# Patient Record
Sex: Female | Born: 1970 | Race: White | Hispanic: No | Marital: Single | Smoking: Former smoker
Health system: Southern US, Community
[De-identification: ages and names within clinical notes are randomized; demographics above are authoritative.]

---

## 2012-12-31 ENCOUNTER — Emergency Department: Payer: Self-pay | Admitting: Emergency Medicine

## 2012-12-31 LAB — URINALYSIS, COMPLETE
Blood: NEGATIVE
Glucose,UR: NEGATIVE mg/dL (ref 0–75)
Leukocyte Esterase: NEGATIVE
Nitrite: NEGATIVE
Squamous Epithelial: 2

## 2012-12-31 LAB — PREGNANCY, URINE: Pregnancy Test, Urine: NEGATIVE m[IU]/mL

## 2013-09-14 ENCOUNTER — Emergency Department: Payer: Self-pay | Admitting: Emergency Medicine

## 2013-09-14 LAB — BASIC METABOLIC PANEL
ANION GAP: 5 — AB (ref 7–16)
BUN: 8 mg/dL (ref 7–18)
CHLORIDE: 109 mmol/L — AB (ref 98–107)
CREATININE: 0.68 mg/dL (ref 0.60–1.30)
Calcium, Total: 9.3 mg/dL (ref 8.5–10.1)
Co2: 24 mmol/L (ref 21–32)
EGFR (African American): >60
EGFR (Non-African Amer.): >60
Glucose: 97 mg/dL (ref 65–99)
OSMOLALITY: 274 (ref 275–301)
POTASSIUM: 3.6 mmol/L (ref 3.5–5.1)
Sodium: 138 mmol/L (ref 136–145)

## 2013-09-14 LAB — URINALYSIS, COMPLETE
BILIRUBIN, UR: NEGATIVE
BLOOD: NEGATIVE
Glucose,UR: NEGATIVE mg/dL (ref 0–75)
Nitrite: NEGATIVE
Ph: 6 (ref 4.5–8.0)
Protein: 30
RBC,UR: <1 /HPF (ref 0–5)
SPECIFIC GRAVITY: 1.028 (ref 1.003–1.030)
Squamous Epithelial: 25
WBC UR: 6 /HPF (ref 0–5)

## 2013-09-14 LAB — CBC WITH DIFFERENTIAL/PLATELET
Basophil #: 0.1 10*3/uL (ref 0.0–0.1)
Basophil %: 0.7 %
Eosinophil #: 0.1 10*3/uL (ref 0.0–0.7)
Eosinophil %: 0.5 %
HCT: 39.2 % (ref 35.0–47.0)
HGB: 13.4 g/dL (ref 12.0–16.0)
Lymphocyte #: 1.9 10*3/uL (ref 1.0–3.6)
Lymphocyte %: 18.2 %
MCH: 32.2 pg (ref 26.0–34.0)
MCHC: 34.2 g/dL (ref 32.0–36.0)
MCV: 94 fL (ref 80–100)
Monocyte #: 0.5 x10 3/mm (ref 0.2–0.9)
Monocyte %: 5.1 %
NEUTROS PCT: 75.5 %
Neutrophil #: 7.8 10*3/uL — ABNORMAL HIGH (ref 1.4–6.5)
Platelet: 260 10*3/uL (ref 150–440)
RBC: 4.16 10*6/uL (ref 3.80–5.20)
RDW: 13.1 % (ref 11.5–14.5)
WBC: 10.4 10*3/uL (ref 3.6–11.0)

## 2013-09-14 LAB — TROPONIN I: Troponin-I: <0.02 ng/mL

## 2018-04-16 ENCOUNTER — Emergency Department: Payer: 59

## 2018-04-16 ENCOUNTER — Encounter: Payer: Self-pay | Admitting: Emergency Medicine

## 2018-04-16 ENCOUNTER — Other Ambulatory Visit: Payer: Self-pay

## 2018-04-16 ENCOUNTER — Emergency Department
Admission: EM | Admit: 2018-04-16 | Discharge: 2018-04-16 | Disposition: A | Discharge status: Home / Self Care | Payer: 59 | Attending: Emergency Medicine | Admitting: Emergency Medicine

## 2018-04-16 DIAGNOSIS — R5383 Other fatigue: Secondary | ICD-10-CM | POA: Diagnosis present

## 2018-04-16 DIAGNOSIS — N10 Acute tubulo-interstitial nephritis: Secondary | ICD-10-CM

## 2018-04-16 DIAGNOSIS — Z87891 Personal history of nicotine dependence: Secondary | ICD-10-CM | POA: Insufficient documentation

## 2018-04-16 DIAGNOSIS — R1011 Right upper quadrant pain: Secondary | ICD-10-CM | POA: Diagnosis not present

## 2018-04-16 LAB — COMPREHENSIVE METABOLIC PANEL
ALBUMIN: 3.3 g/dL — AB (ref 3.5–5.0)
ALT: 33 U/L (ref 0–44)
AST: 29 U/L (ref 15–41)
Alkaline Phosphatase: 119 U/L (ref 38–126)
Anion gap: 12 (ref 5–15)
BILIRUBIN TOTAL: 1 mg/dL (ref 0.3–1.2)
BUN: 18 mg/dL (ref 6–20)
CO2: 22 mmol/L (ref 22–32)
Calcium: 8.8 mg/dL — ABNORMAL LOW (ref 8.9–10.3)
Chloride: 101 mmol/L (ref 98–111)
Creatinine, Ser: 1.15 mg/dL — ABNORMAL HIGH (ref 0.44–1.00)
GFR calc Af Amer: >60 mL/min (ref >60)
GFR calc non Af Amer: 56 mL/min — ABNORMAL LOW (ref >60)
GLUCOSE: 109 mg/dL — AB (ref 70–99)
Potassium: 3.4 mmol/L — ABNORMAL LOW (ref 3.5–5.1)
Sodium: 135 mmol/L (ref 135–145)
TOTAL PROTEIN: 7.3 g/dL (ref 6.5–8.1)

## 2018-04-16 LAB — URINALYSIS, COMPLETE (UACMP) WITH MICROSCOPIC
BACTERIA UA: NONE SEEN
BILIRUBIN URINE: NEGATIVE
GLUCOSE, UA: NEGATIVE mg/dL
KETONES UR: 5 mg/dL — AB
NITRITE: NEGATIVE
PH: 5 (ref 5.0–8.0)
Protein, ur: 100 mg/dL — AB
SPECIFIC GRAVITY, URINE: 1.017 (ref 1.005–1.030)

## 2018-04-16 LAB — CBC WITH DIFFERENTIAL/PLATELET
BASOS PCT: 0 %
Basophils Absolute: 0 10*3/uL (ref 0–0.1)
Eosinophils Absolute: 0 10*3/uL (ref 0–0.7)
Eosinophils Relative: 0 %
HEMATOCRIT: 37.3 % (ref 35.0–47.0)
Hemoglobin: 12.9 g/dL (ref 12.0–16.0)
LYMPHS ABS: 0.5 10*3/uL — AB (ref 1.0–3.6)
LYMPHS PCT: 6 %
MCH: 32.1 pg (ref 26.0–34.0)
MCHC: 34.7 g/dL (ref 32.0–36.0)
MCV: 92.6 fL (ref 80.0–100.0)
MONO ABS: 0.4 10*3/uL (ref 0.2–0.9)
MONOS PCT: 5 %
Neutro Abs: 7.1 10*3/uL — ABNORMAL HIGH (ref 1.4–6.5)
Neutrophils Relative %: 89 %
Platelets: 103 10*3/uL — ABNORMAL LOW (ref 150–440)
RBC: 4.03 MIL/uL (ref 3.80–5.20)
RDW: 13.4 % (ref 11.5–14.5)
WBC: 7.9 10*3/uL (ref 3.6–11.0)

## 2018-04-16 LAB — PREGNANCY, URINE: PREG TEST UR: NEGATIVE

## 2018-04-16 LAB — TSH: TSH: 1.7 u[IU]/mL (ref 0.350–4.500)

## 2018-04-16 LAB — LACTIC ACID, PLASMA: Lactic Acid, Venous: 1.4 mmol/L (ref 0.5–1.9)

## 2018-04-16 MED ORDER — CEPHALEXIN 500 MG PO CAPS: 500.0000 mg | ORAL_CAPSULE | Freq: Three times a day (TID) | ORAL | 0 refills | Status: AC

## 2018-04-16 MED ORDER — ACETAMINOPHEN 500 MG PO TABS
1000.0000 mg | ORAL_TABLET | Freq: Once | ORAL | Status: AC
  Administered 2018-04-16: 1000 mg via ORAL

## 2018-04-16 MED ORDER — SODIUM CHLORIDE 0.9 % IV SOLN
1.0000 g | INTRAVENOUS | Status: AC
  Administered 2018-04-16: 1 g via INTRAVENOUS
  Filled 2018-04-16: qty 10

## 2018-04-16 MED ORDER — ACETAMINOPHEN 500 MG PO TABS
ORAL_TABLET | ORAL | Status: AC
  Filled 2018-04-16: qty 2

## 2018-04-16 NOTE — ED Provider Notes (Signed)
[facility] Emergency Department Provider Note  ____________________________________________   First MD Initiated Contact with Patient 04/16/18 1107     (approximate)  I have reviewed the triage vital signs and the nursing notes.   HISTORY  Chief Complaint Chills    HPI Janice Patterson is a 47 y.o. female who reports no chronic medical issues other than daily tobacco use who presents for evaluation of about 4 days of general malaise, chills, fever with T-max of 104, nausea, vomiting, and diarrhea as well as some right upper quadrant abdominal pain.  She reports that it started with chills/rigors where she was shivering and her teeth were chattering so hard that she could not be still or get warm in spite of sitting in her truck that is been outside in the hot weather.  She is also had 2 instances of measured fever at home with the highest temperature recorded being 104.  3 and 4 days ago she was having multiple episodes of nausea and vomiting but she has not had any vomiting for at least 2 to 3 days.  She also had multiple episodes of diarrhea and her last episode of diarrhea was yesterday after taking 2 Imodium.  She had some aching upper abdominal pain that has persisted but she reports that it started after she was vomiting and attributed it to sore muscles from the vomiting.  Nothing in particular made her symptoms better or worse and she describes them as severe.  She has not had any difficulty breathing, cough, chest pain, headache, visual changes, dysuria, and hematuria.  She has not had any sharp or stabbing lower abdominal or flank pain and has no history of kidney stones.  She is not an IV drug user and takes no medications.  She has not had a regular menstrual cycle in about a year and recognizes that some of the fever/chills she is experiencing could be attributed to hormones.   History reviewed. No pertinent past medical history.  There are no active  problems to display for this patient.   History reviewed. No pertinent surgical history.  Prior to Admission medications   Medication Sig Start Date End Date Taking? Authorizing Provider  acetaminophen (TYLENOL) 500 MG tablet Take 500-1,000 mg by mouth every 6 (six) hours as needed for mild pain.   Yes [provider]  ibuprofen (ADVIL,MOTRIN) 200 MG tablet Take 400-600 mg by mouth every 6 (six) hours as needed for headache.   Yes [provider]  ondansetron (ZOFRAN) 4 MG tablet Take 4 mg by mouth every 8 (eight) hours as needed for nausea. 04/15/18  Yes [provider]  cephALEXin (KEFLEX) 500 MG capsule Take 1 capsule (500 mg total) by mouth 3 (three) times daily. 04/16/18   Loleta RoseForbach, Cejay Cambre, MD    Allergies Penicillin g and Sulfa antibiotics  No family history on file.  Social History Social History   Tobacco Use  . Smoking status: Former Games developermoker  . Smokeless tobacco: Current User  Substance Use Topics  . Alcohol use: Not on file  . Drug use: Not on file    Review of Systems Constitutional: Chills/rigors with measured fever at home up to 104 degrees yesterday and 102 degrees today but she took ibuprofen before coming to the ED.  General malaise. Eyes: No visual changes. ENT: No sore throat. Cardiovascular: Denies chest pain. Respiratory: Denies shortness of breath. Gastrointestinal: Nausea, vomiting, and diarrhea 3 to 4 days ago, now improved but with some persistent upper  abdominal pain Genitourinary: Negative for dysuria.  No gross hematuria. Musculoskeletal: Negative for neck pain.  Negative for back pain. Integumentary: Negative for rash. Neurological: Negative for headaches, focal weakness or numbness.  ____________________________________________   PHYSICAL EXAM:  VITAL SIGNS: ED Triage Vitals  Enc Vitals Group     BP 04/16/18 0947 (!) 152/63     Pulse Rate 04/16/18 0947 85     Resp 04/16/18 0947 20     Temp 04/16/18 0947 98.7 F  (37.1 C)     Temp Source 04/16/18 0947 Oral     SpO2 04/16/18 0947 100 %     Weight 04/16/18 0949 65.3 kg (144 lb)     Height 04/16/18 0949 1.664 m (5' 5.5")     Head Circumference --      Peak Flow --      Pain Score 04/16/18 0949 6     Pain Loc --      Pain Edu? --      Excl. in GC? --     Constitutional: Alert and oriented. Well appearing and in no acute distress. Eyes: Conjunctivae are normal.  Head: Atraumatic. Ears:  Healthy appearing ear canals and TMs bilaterally Nose: No congestion/rhinnorhea. Mouth/Throat: Mucous membranes are moist.  Oropharynx non-erythematous. Neck: No stridor.  No meningeal signs.   Cardiovascular: Normal rate, regular rhythm. Good peripheral circulation. Grossly normal heart sounds. Respiratory: Normal respiratory effort.  No retractions. Lungs CTAB. Gastrointestinal: Soft and nondistended.  Right upper quadrant tenderness to palpation with positive Murphy sign.  No lower abdominal tenderness, no rebound and no guarding. Musculoskeletal: No lower extremity tenderness nor edema. No gross deformities of extremities. Neurologic:  Normal speech and language. No gross focal neurologic deficits are appreciated.  Skin:  Skin is warm, dry and intact. No rash noted. Psychiatric: Mood and affect are normal. Speech and behavior are normal.  ____________________________________________   LABS (all labs ordered are listed, but only abnormal results are displayed)  Labs Reviewed  COMPREHENSIVE METABOLIC PANEL - Abnormal; Notable for the following components:      Result Value   Potassium 3.4 (*)    Glucose, Bld 109 (*)    Creatinine, Ser 1.15 (*)    Calcium 8.8 (*)    Albumin 3.3 (*)    GFR calc non Af Amer 56 (*)    All other components within normal limits  CBC WITH DIFFERENTIAL/PLATELET - Abnormal; Notable for the following components:   Platelets 103 (*)    Neutro Abs 7.1 (*)    Lymphs Abs 0.5 (*)    All other components within normal limits    URINALYSIS, COMPLETE (UACMP) WITH MICROSCOPIC - Abnormal; Notable for the following components:   Color, Urine AMBER (*)    APPearance CLOUDY (*)    Hgb urine dipstick MODERATE (*)    Ketones, ur 5 (*)    Protein, ur 100 (*)    Leukocytes, UA TRACE (*)    Non Squamous Epithelial PRESENT (*)    All other components within normal limits  URINE CULTURE  LACTIC ACID, PLASMA  TSH  PREGNANCY, URINE   ____________________________________________  EKG  None - EKG not ordered by ED physician ____________________________________________  RADIOLOGY Marylou Mccoy, personally viewed and evaluated these images (plain radiographs) as part of my medical decision making, as well as reviewing the written report by the radiologist.  ED MD interpretation: Normal chest x-ray with no evidence of infiltrate  Official radiology report(s): Dg Chest 2 View  Result Date: 04/16/2018  CLINICAL DATA:  C/O N/V/D x 6 days. Also c/o chills and generalized body aches; Former smoker EXAM: CHEST - 2 VIEW COMPARISON:  None. FINDINGS: The heart size and mediastinal contours are within normal limits. Both lungs are clear. The visualized skeletal structures are unremarkable. IMPRESSION: No active cardiopulmonary disease. No evidence of pneumonia or pulmonary edema. Electronically Signed   By: Bary Richard M.D.   On: 04/16/2018 10:43   Ct Renal Stone Study  Result Date: 04/16/2018 CLINICAL DATA:  Fever and nausea EXAM: CT ABDOMEN AND PELVIS WITHOUT CONTRAST TECHNIQUE: Multidetector CT imaging of the abdomen and pelvis was performed following the standard protocol without oral or IV contrast. COMPARISON:  None. FINDINGS: Lower chest: There is mild bibasilar lung atelectasis. Lung bases elsewhere are clear. Hepatobiliary: No focal liver lesions are evident this noncontrast enhanced study. Gallbladder is distended without wall thickening. There is no appreciable biliary duct dilatation. Pancreas: There is no pancreatic  mass or inflammatory focus. Spleen: No splenic lesions are appreciable. Adrenals/Urinary Tract: Right adrenal appears normal. There is left adrenal hypertrophy. Right kidney is diffusely edematous with moderate perinephric stranding on the right. There is no renal mass on either side. There is no left renal edema. There is slight hydronephrosis on the right. There is no hydronephrosis on the left. There is no intrarenal calculus on either side. There is no appreciable ureteral calculus on either side. Urinary bladder is midline with wall thickening. Stomach/Bowel: There are sigmoid diverticula without diverticulitis. There is no appreciable bowel wall or mesenteric thickening. No evident bowel obstruction. No free air or portal venous air evident. Vascular/Lymphatic: There are foci of aortic and left common iliac artery atherosclerosis. No aneurysm evident. There is no appreciable adenopathy in the abdomen or pelvis. Reproductive: Uterus is anteverted.  No evident pelvic mass. Other: Appendix appears normal. There is no abscess or ascites in the abdomen or pelvis. Musculoskeletal: There is degenerative change in the lumbar spine. There are no blastic or lytic bone lesions. No intramuscular lesions are evident. No abdominal wall lesions are evident. IMPRESSION: 1. Right kidney is diffusely edematous with mild hydronephrosis and moderate perinephric stranding. No evident renal or ureteral calculus. Question recent calculus passage on the right. It should be noted that pyelonephritis may present in this manner. No well-defined abscess is seen in the right kidney region on this noncontrast enhanced study. There is no perinephric fluid. 2. Urinary bladder wall appears thickened, a finding likely due to a degree of cystitis. 3. Gallbladder distended without wall thickening. Etiology for this finding uncertain. 4. No bowel obstruction. Sigmoid diverticula present without diverticulitis. No abscess evident in the abdomen or  pelvis. Appendix appears normal. 5.  There is aortic atherosclerosis. Aortic Atherosclerosis (ICD10-I70.0). Electronically Signed   By: Bretta Bang III M.D.   On: 04/16/2018 13:35   US Abdomen Limited Ruq  Result Date: 04/16/2018 CLINICAL DATA:  Right upper quadrant pain for 2 days EXAM: ULTRASOUND ABDOMEN LIMITED RIGHT UPPER QUADRANT COMPARISON:  None. FINDINGS: Gallbladder: Gallbladder is well distended. Some echogenic foci are noted which are non mobile and measures just under 1 cm consistent with gallbladder polyps. No definitive cholelithiasis is seen. No wall thickening or pericholecystic fluid is noted. Common bile duct: Diameter: 3 mm Liver: Generalized increased echogenicity is noted within the liver consistent with fatty infiltration. An area of focal fatty sparing is noted adjacent to the gallbladder fossa. Portal vein is patent on color Doppler imaging with normal direction of blood flow towards the liver. IMPRESSION: Fatty  liver. Gallbladder polyps.  No evidence of cholelithiasis. Electronically Signed   By: Alcide Clever M.D.   On: 04/16/2018 12:25    ____________________________________________   PROCEDURES  Critical Care performed: No   Procedure(s) performed:   Procedures   ____________________________________________   INITIAL IMPRESSION / ASSESSMENT AND PLAN / ED COURSE  As part of my medical decision making, I reviewed the following data within the electronic MEDICAL RECORD NUMBER Nursing notes reviewed and incorporated, Labs reviewed , Old chart reviewed and Radiograph reviewed     Differential diagnosis includes, but is not limited to, nonspecific viral illness (specifically a viral gastroenteritis), perimenopause or other hormonal imbalance, hypothyroidism leading to chills, urinary tract infection/pyelonephritis, bacteremia.  Patient has no respiratory symptoms so pneumonia is very unlikely and made even more unlikely based on chest x-ray.  Vital signs are normal  other than hypertension and she is afebrile although she does report she took 2 ibuprofen before coming to the ED.  Her lactic acid is 1.4 which is within normal limits and her CBC is reassuring with no leukocytosis.  Conference of metabolic panel is notable only for a very slightly decreased potassium likely reflective of the recent vomiting and her creatinine is 1.15 which may be normal for her but could also reflect slight volume depletion.  She states she is already drinking extra fluids including Gatorade because [facility] the day before and was told she was mildly dehydrated.  She was also started on Macrobid for possible UTI.  Her urinalysis is notable today for moderate hemoglobin, a few ketones, routine 100, and some RBCs and WBCs but no bacteria seen.  Difficult to interpret the hematuria in the setting the other symptoms she is describing; her symptoms are not consistent with kidney stones, and she could be having some light vaginal spotting that is contaminating the urine specimen.  I am sending a urine culture at this time and will encourage her to continue taking the Macrobid.  Based on the right upper quadrant tenderness to palpation and positive Murphy sign, I am obtaining a right upper quadrant ultrasound to rule out cholecystitis or to evaluate for cholelithiasis.  I will reassess after the ultrasound to determine if any additional testing is needed such as a CT scan renal study protocol to rule out the possibility of the patient having an infected stone; even though she has no leukocytosis, and infected stone could explain her hematuria, her fever and chills, and could result in significant morbidity/mortality if missed.  Patient agrees with the current plan of care.  Clinical Course as of Apr 16 1438  Fri Apr 16, 2018  1253 Within normal limits which is reassuring  TSH: 1.700 [CF]  1253 Gallbladder ultrasound showed no acute pathology, did indicate probable  gallbladder polyps.  Given my concern for the possibility of an infected stone as described above, I have ordered a CT renal stone study after describing it in my line reasoning to the patient.  She understands and agrees with the plan.  US ABDOMEN LIMITED RUQ [CF]  1254 She is starting to have more of a headache and I have ordered Tylenol 1000 mg p.o.  As per her request   [CF]  1346 The CT scan does not show any evidence of stone.  There was some hydronephrosis and it is possible that the patient has passed a stone but it is also possible that she has pyelonephritis.  Given the constellation of results and symptoms I  am giving her a dose of ceftriaxone 1 g IV and will discharge her on Keflex and tell her to stop taking the nitrofurantoin.  She may have some mild pyelonephritis which would explain her symptoms and which I think will be amenable to outpatient treatment.  I will update the patient with all these results including the plan for discharge and outpatient follow-up and my usual and customary return precautions.  CT Renal [facility] [place] [CF]    Clinical Course User Index [CF] Loleta Rose, MD    ____________________________________________  FINAL CLINICAL IMPRESSION(S) / ED DIAGNOSES  Final diagnoses:  Acute pyelonephritis     MEDICATIONS GIVEN DURING THIS VISIT:  Medications  acetaminophen (TYLENOL) 500 MG tablet (  Not Given 04/16/18 1336)  cefTRIAXone (ROCEPHIN) 1 g in sodium chloride 0.9 % 100 mL IVPB (1 g Intravenous New Bag/Given 04/16/18 1419)  acetaminophen (TYLENOL) tablet 1,000 mg (1,000 mg Oral Given 04/16/18 1000)     ED Discharge Orders         Ordered    cephALEXin (KEFLEX) 500 MG capsule  3 times daily     04/16/18 1437           Note:  This document was prepared using Dragon voice recognition software and may include unintentional dictation errors.    Loleta Rose, MD 04/16/18 [id number]

## 2018-04-16 NOTE — ED Triage Notes (Signed)
Chills x 3 days. Nausea. Denies sore throat or other symptoms that would explain fever and chills.

## 2018-04-16 NOTE — ED Triage Notes (Signed)
First Nurse Note:  C/O N/V/D x 6 days. Also c/o chills and generalized body aches. Seen through [facility] last night, with no improvement.  Patient is AAOx3.  Skin warm and dry.  Ambulates with easy and steady gait.  NAD

## 2018-04-16 NOTE — ED Notes (Signed)
Pt verbalized understanding of discharge instructions. NAD at this time. 

## 2018-04-16 NOTE — Discharge Instructions (Signed)
Your workup today suggests that you have a urinary tract infection (UTI) which has spread to your kidneys.  It is possible you had a kidney stone , but there is currently no evidence of a stone on your CT scan.  Please take your antibiotic as prescribed and over-the-counter pain medication (Tylenol or Motrin) as needed, but no more than recommended on the label instructions.  Drink PLENTY of fluids.  Please stop taking the antibiotic previously prescribed (nitrofurantoin) and take the new prescription provided today.  Call your regular doctor to schedule the next available appointment to follow up on todays ED visit, or return immediately to the ED if your pain worsens, you have decreased urine production, develop fever, persistent vomiting, or other symptoms that concern you.

## 2018-04-16 NOTE — ED Notes (Signed)
Seen last pm at [facility]  For possible uti placed on anti biotics

## 2018-04-16 NOTE — ED Notes (Signed)
Back from ct. Scan .

## 2018-04-17 LAB — URINE CULTURE
Culture: NO GROWTH
Special Requests: NORMAL

## 2019-11-03 IMAGING — CT CT RENAL STONE PROTOCOL
2 of 4 series · 15 of 46 positions shown, 17 images · non-contrast
Comparison: None.

CLINICAL DATA: Fever and nausea

EXAM:
CT ABDOMEN AND PELVIS WITHOUT CONTRAST
TECHNIQUE: Multidetector CT imaging of the abdomen and pelvis was performed
following the standard protocol without oral or IV contrast.

[Series 2: stone full standard · axial · 0.71mm/px · z∈[-493,-63]mm · 12 of 94 slices shown, 14 images]
[im 4/94  soft-tissue]
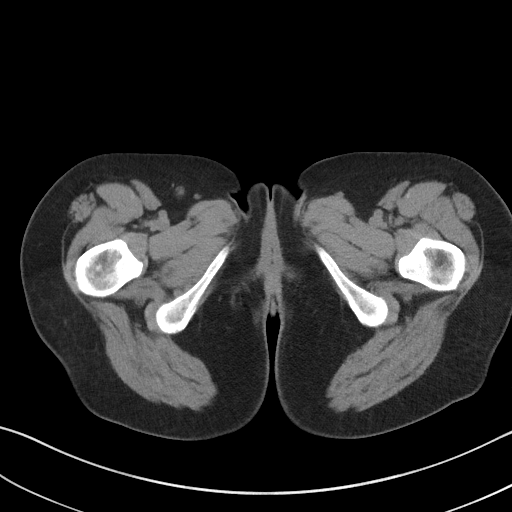
[im 4/94  bone]
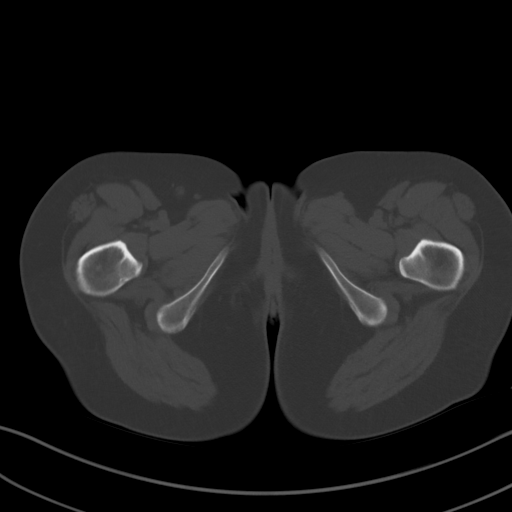
[im 12/94  soft-tissue]
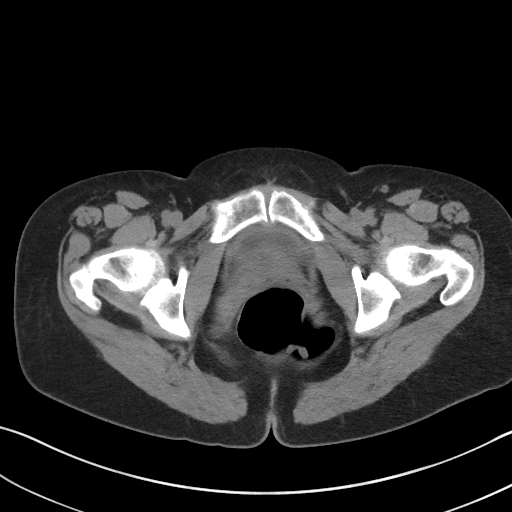
[im 20/94  soft-tissue]
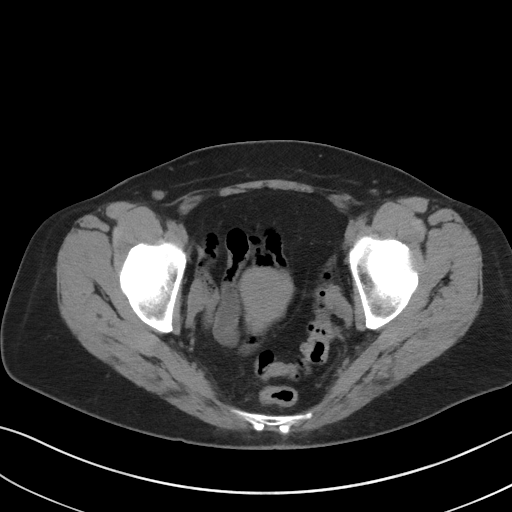
[im 28/94  soft-tissue]
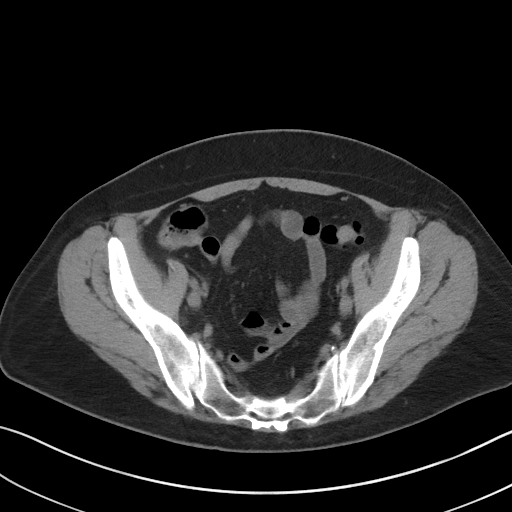
[im 35/94  soft-tissue]
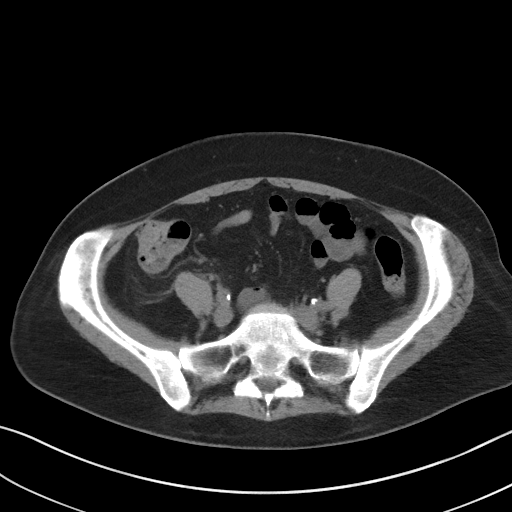
[im 43/94  soft-tissue]
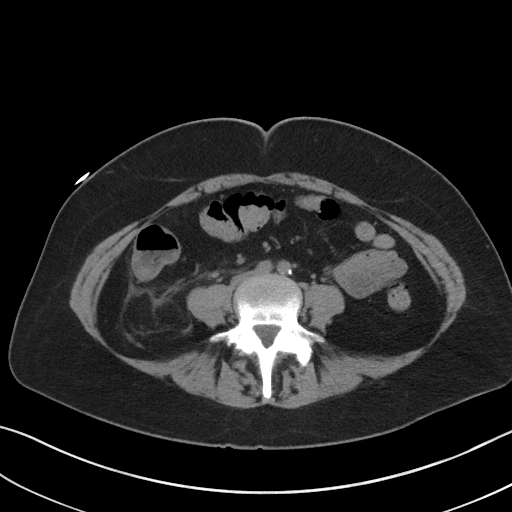
[im 51/94  soft-tissue]
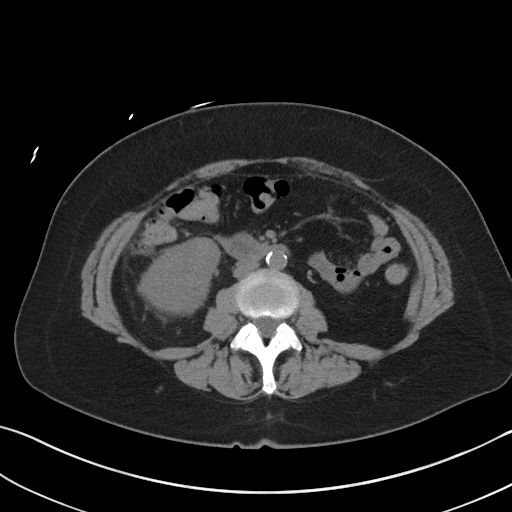
[im 59/94  soft-tissue]
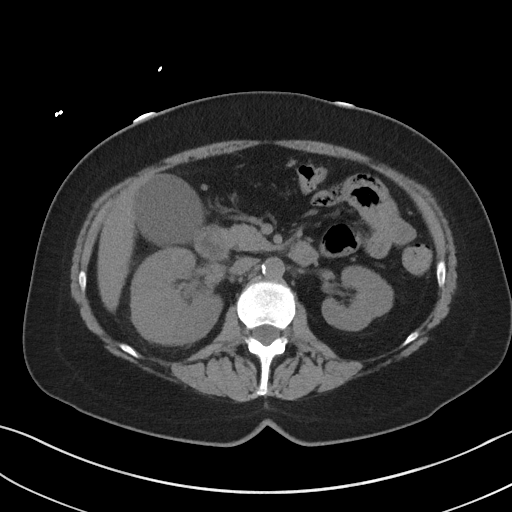
[im 66/94  soft-tissue]
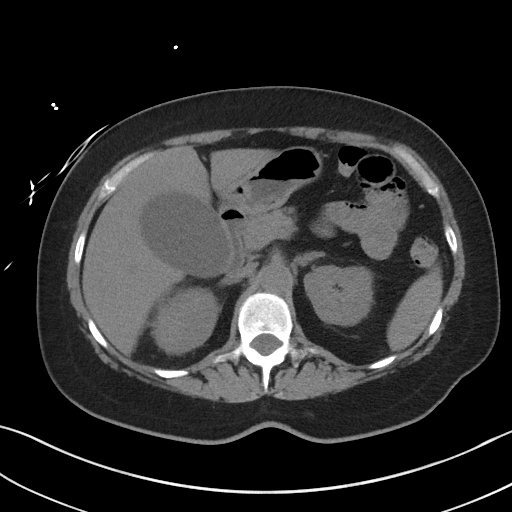
[im 66/94  bone]
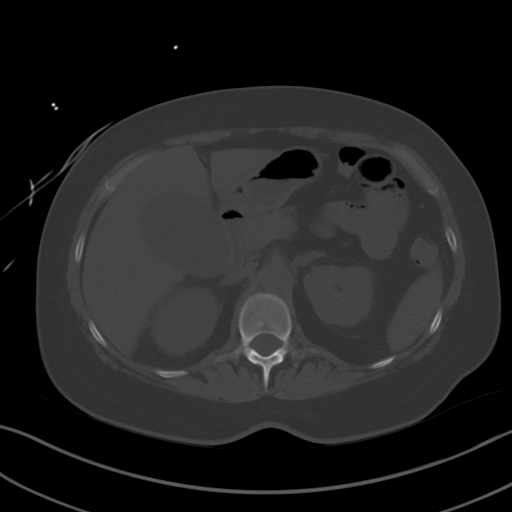
[im 74/94  soft-tissue]
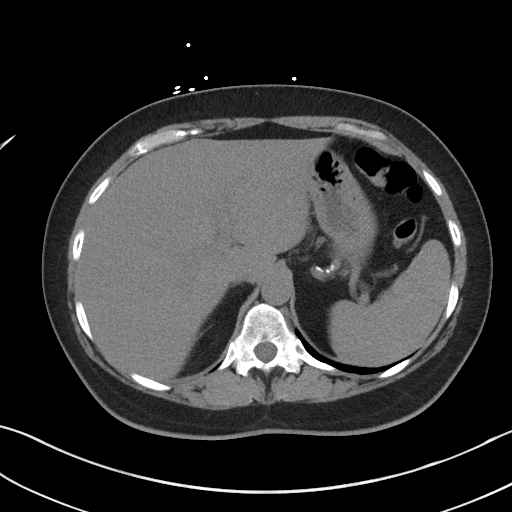
[im 82/94  soft-tissue]
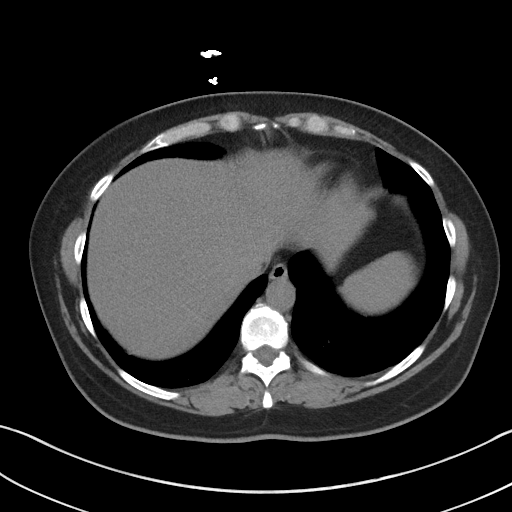
[im 90/94  soft-tissue]
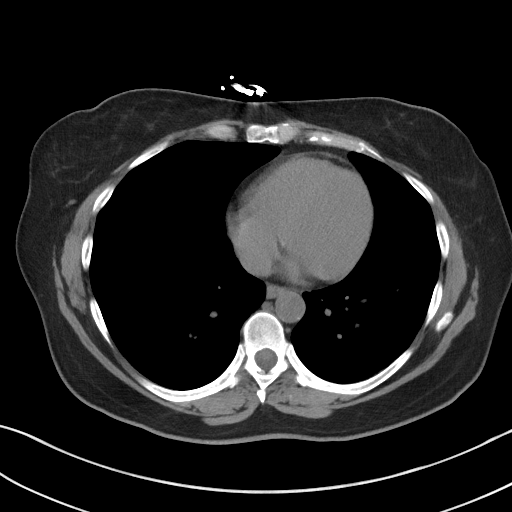

[Series 5: coronal · coronal · 0.77mm/px · 3 of 125 slices shown]
[im 42/125  soft-tissue]
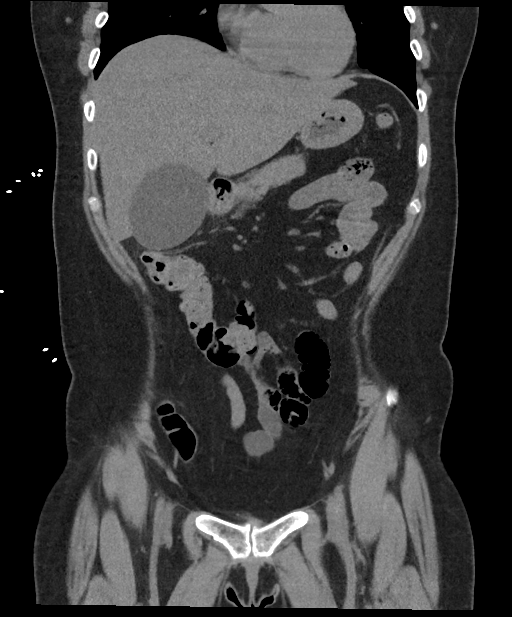
[im 56/125  soft-tissue]
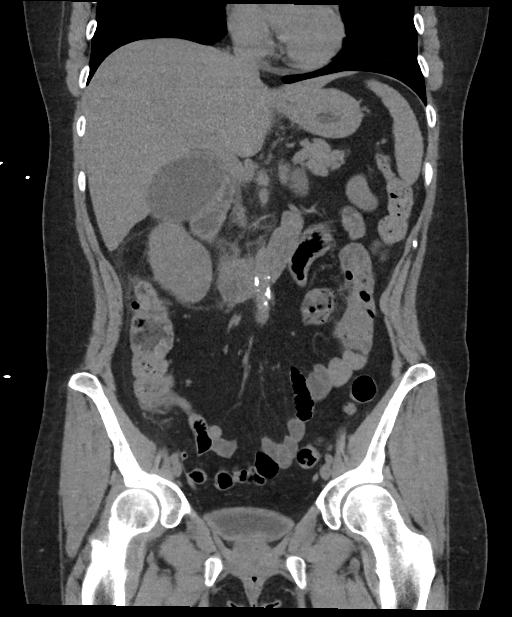
[im 69/125  soft-tissue]
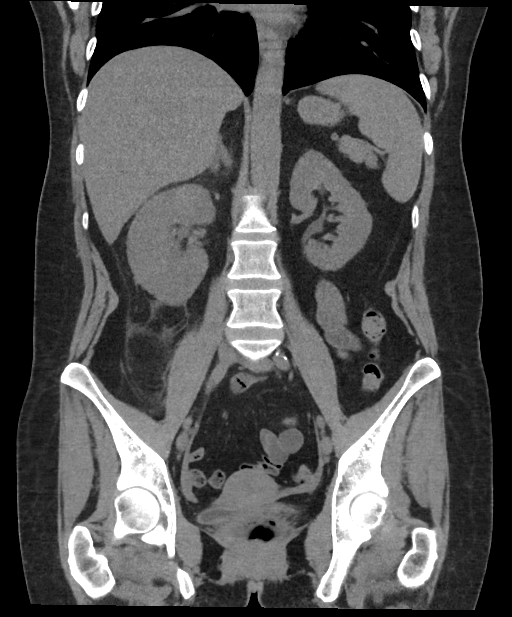

[15 of 46 positions shown; findings below may reference images not displayed]

FINDINGS: Lower chest: There is mild bibasilar lung atelectasis. Lung bases
elsewhere are clear.

Hepatobiliary: No focal liver lesions are evident this noncontrast
enhanced study. Gallbladder is distended without wall thickening.
There is no appreciable biliary duct dilatation.

Pancreas: There is no pancreatic mass or inflammatory focus.

Spleen: No splenic lesions are appreciable.

Adrenals/Urinary Tract: Right adrenal appears normal. There is left
adrenal hypertrophy.

Right kidney is diffusely edematous with moderate perinephric
stranding on the right. There is no renal mass on either side. There
is no left renal edema. There is slight hydronephrosis on the right.
There is no hydronephrosis on the left. There is no intrarenal
calculus on either side. There is no appreciable ureteral calculus
on either side. Urinary bladder is midline with wall thickening.

Stomach/Bowel: There are sigmoid diverticula without diverticulitis.
There is no appreciable bowel wall or mesenteric thickening. No
evident bowel obstruction. No free air or portal venous air evident.

Vascular/Lymphatic: There are foci of aortic and left common iliac
artery atherosclerosis. No aneurysm evident. There is no appreciable
adenopathy in the abdomen or pelvis.

Reproductive: Uterus is anteverted.  No evident pelvic mass.

Other: Appendix appears normal. There is no abscess or ascites in
the abdomen or pelvis.

Musculoskeletal: There is degenerative change in the lumbar spine.
There are no blastic or lytic bone lesions. No intramuscular lesions
are evident. No abdominal wall lesions are evident.
IMPRESSION: 1. Right kidney is diffusely edematous with mild hydronephrosis and
moderate perinephric stranding. No evident renal or ureteral
calculus. Question recent calculus passage on the right. It should
be noted that pyelonephritis may present in this manner. No
well-defined abscess is seen in the right kidney region on this
noncontrast enhanced study. There is no perinephric fluid.

2. Urinary bladder wall appears thickened, a finding likely due to a
degree of cystitis.

3. Gallbladder distended without wall thickening. Etiology for this
finding uncertain.

4. No bowel obstruction. Sigmoid diverticula present without
diverticulitis. No abscess evident in the abdomen or pelvis.
Appendix appears normal.

5.  There is aortic atherosclerosis.

Aortic Atherosclerosis ([id number]-OPI.I).

## 2020-03-16 IMAGING — US US ABDOMEN LIMITED
1 series · 14 of 25 positions shown · non-contrast
Comparison: None.

CLINICAL DATA: Right upper quadrant pain for 2 days

EXAM:
ULTRASOUND ABDOMEN LIMITED RIGHT UPPER QUADRANT

[Series 1: us abdomen limited · 0.26mm/px · 14 of 62 slices shown]
[im 1/62]
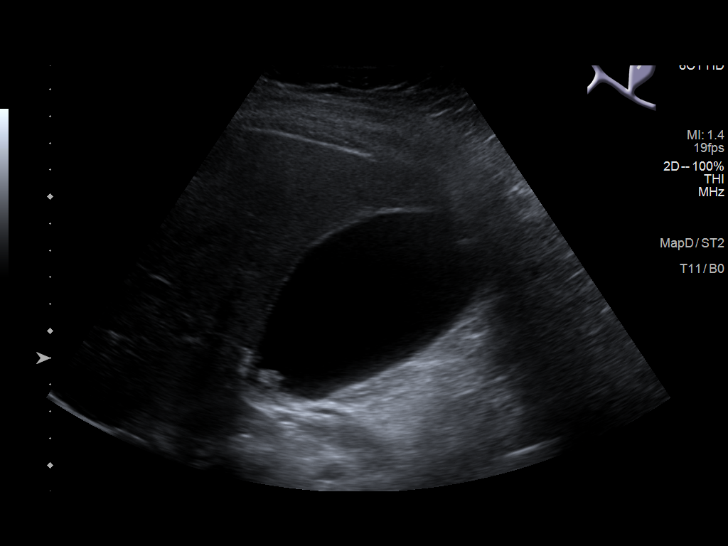
[im 6/62]
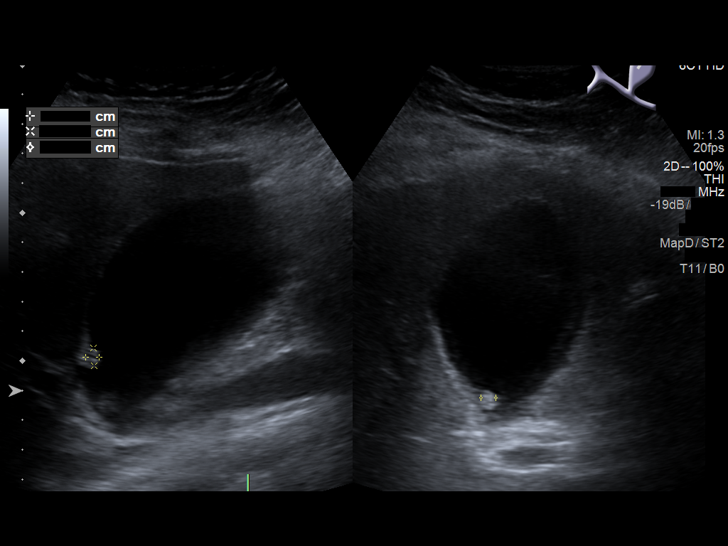
[im 11/62]
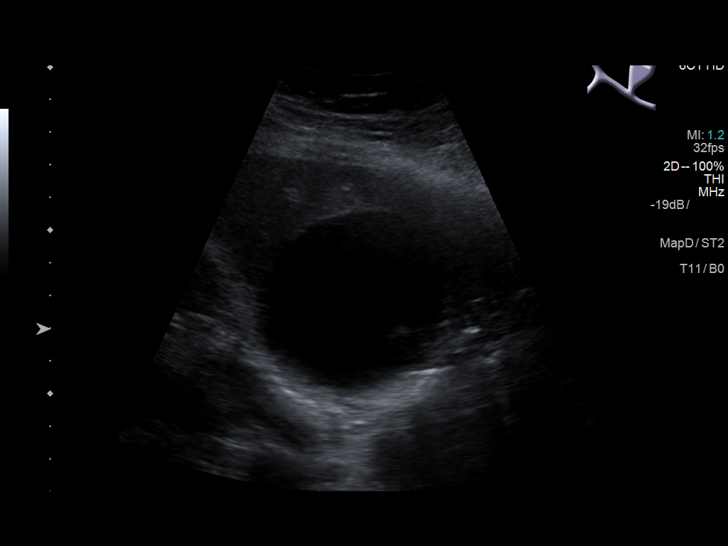
[im 16/62]
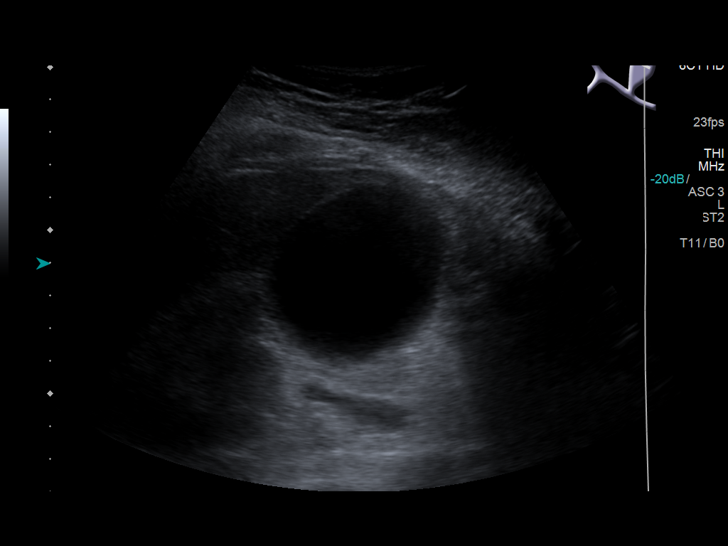
[im 21/62]
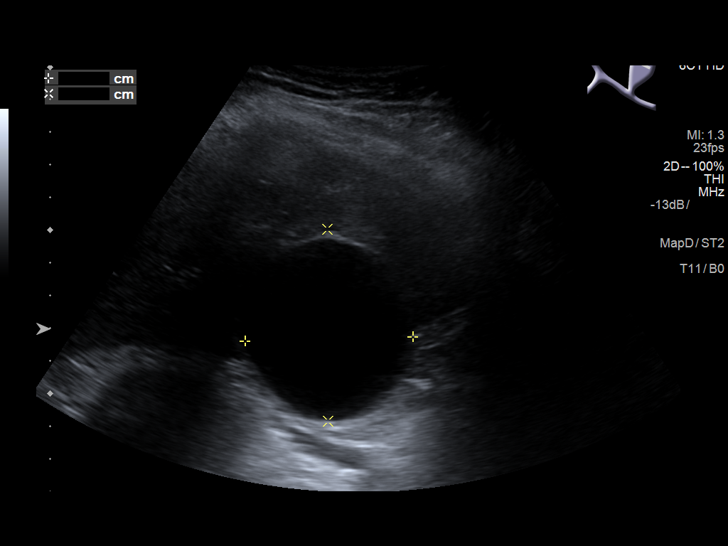
[im 23/62]
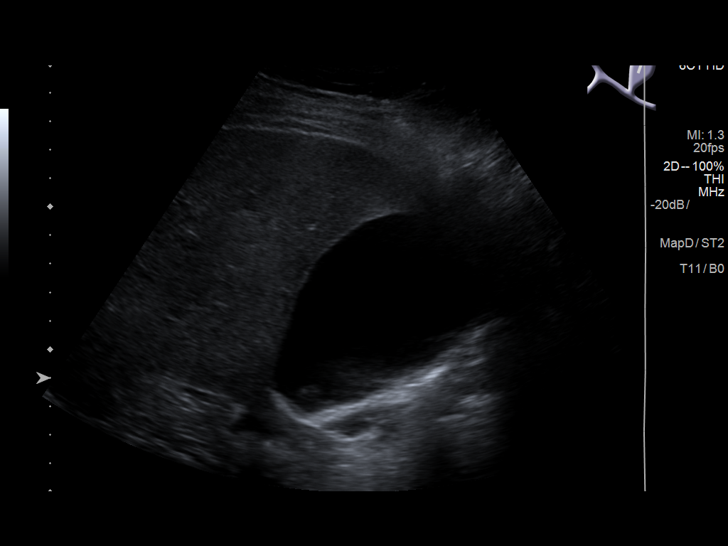
[im 28/62]
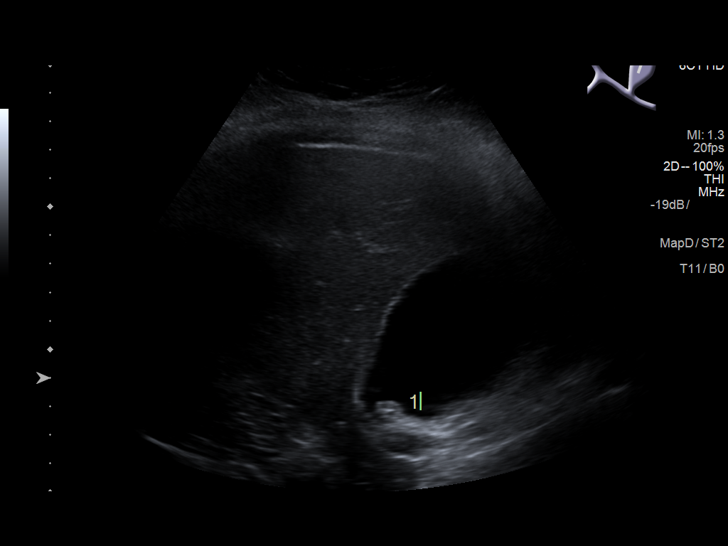
[im 34/62]
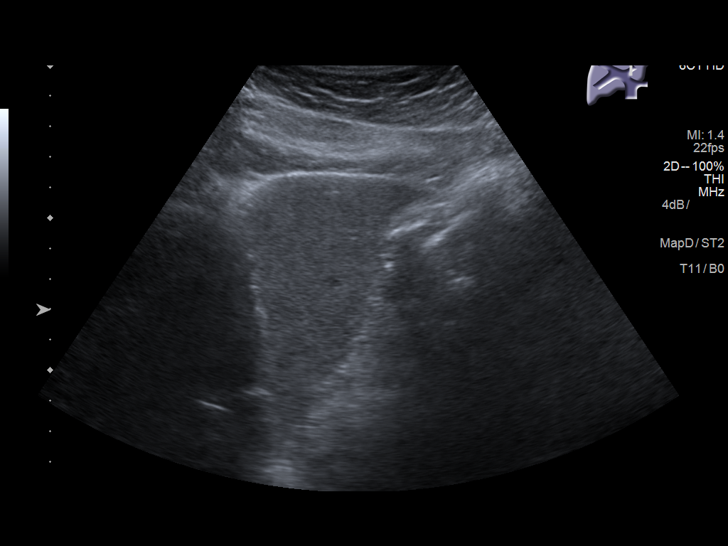
[im 39/62]
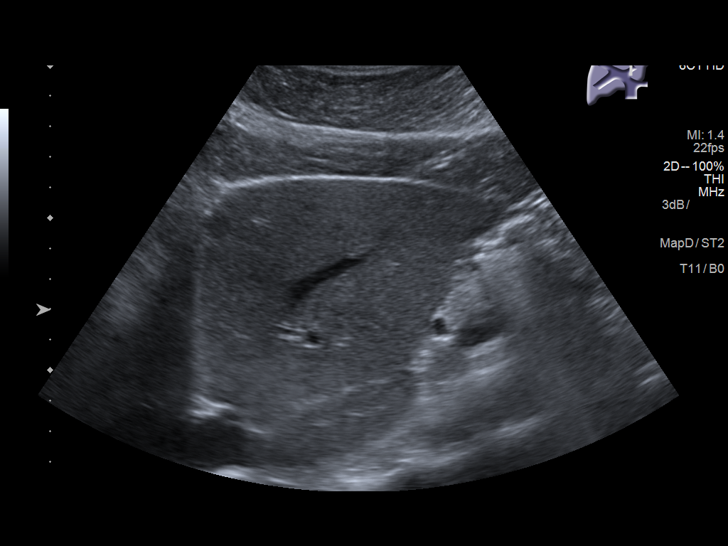
[im 41/62]
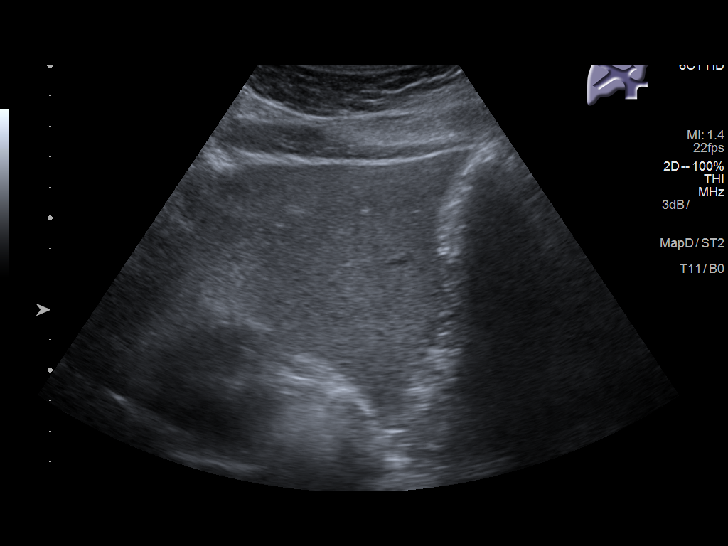
[im 46/62]
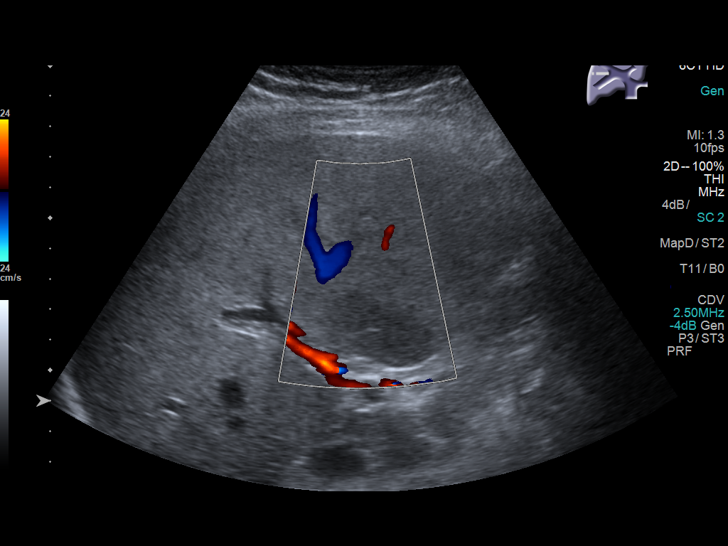
[im 51/62]
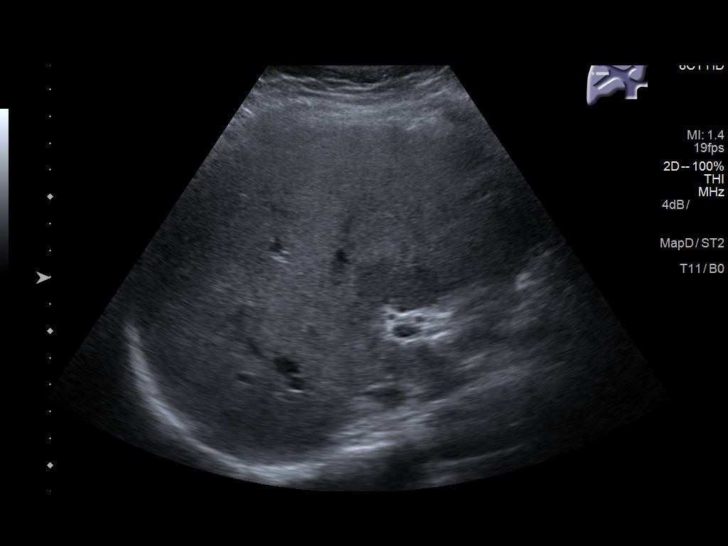
[im 56/62]
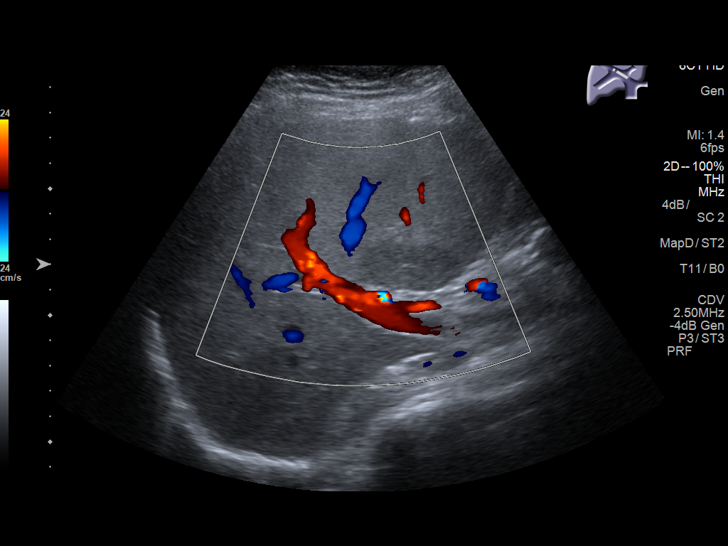
[im 62/62]
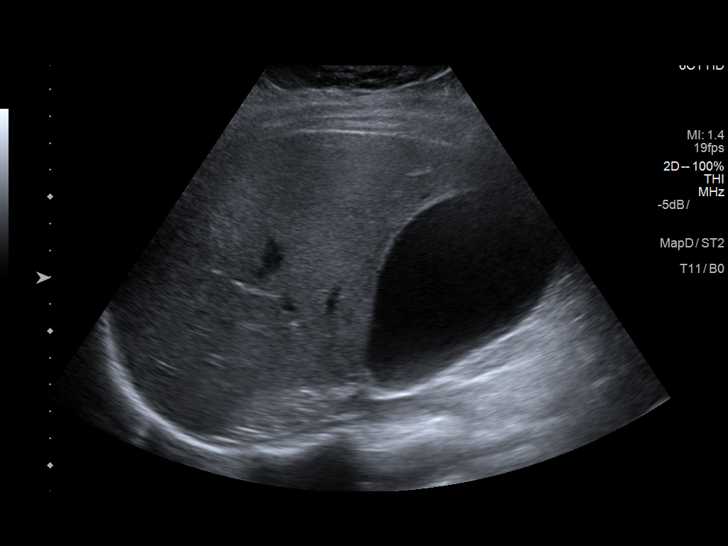

[14 of 25 positions shown; findings below may reference images not displayed]

FINDINGS: Gallbladder:

Gallbladder is well distended. Some echogenic foci are noted which
are non mobile and measures just under 1 cm consistent with
gallbladder polyps. No definitive cholelithiasis is seen. No wall
thickening or pericholecystic fluid is noted.

Common bile duct:

Diameter: 3 mm

Liver:

Generalized increased echogenicity is noted within the liver
consistent with fatty infiltration. An area of focal fatty sparing
is noted adjacent to the gallbladder fossa. Portal vein is patent on
color Doppler imaging with normal direction of blood flow towards
the liver.
IMPRESSION: Fatty liver.

Gallbladder polyps.  No evidence of cholelithiasis.

## 2023-03-28 ENCOUNTER — Other Ambulatory Visit
Admission: RE | Admit: 2023-03-28 | Discharge: 2023-03-28 | Disposition: A | Discharge status: Home / Self Care | Payer: 59 | Source: Ambulatory Visit | Attending: Family Medicine | Admitting: Family Medicine

## 2023-03-28 DIAGNOSIS — R509 Fever, unspecified: Secondary | ICD-10-CM | POA: Diagnosis not present

## 2023-03-28 DIAGNOSIS — Z03818 Encounter for observation for suspected exposure to other biological agents ruled out: Secondary | ICD-10-CM | POA: Insufficient documentation

## 2023-03-28 DIAGNOSIS — R519 Headache, unspecified: Secondary | ICD-10-CM | POA: Insufficient documentation

## 2023-03-28 LAB — COMPREHENSIVE METABOLIC PANEL
ALT: 13 U/L (ref 0–44)
AST: 14 U/L — ABNORMAL LOW (ref 15–41)
Albumin: 3.9 g/dL (ref 3.5–5.0)
Alkaline Phosphatase: 92 U/L (ref 38–126)
Anion gap: 10 (ref 5–15)
BUN: 13 mg/dL (ref 6–20)
CO2: 27 mmol/L (ref 22–32)
Calcium: 9.1 mg/dL (ref 8.9–10.3)
Chloride: 101 mmol/L (ref 98–111)
Creatinine, Ser: 0.97 mg/dL (ref 0.44–1.00)
GFR, Estimated: >60 mL/min (ref >60)
Glucose, Bld: 116 mg/dL — ABNORMAL HIGH (ref 70–99)
Potassium: 4.4 mmol/L (ref 3.5–5.1)
Sodium: 138 mmol/L (ref 135–145)
Total Bilirubin: 0.9 mg/dL (ref 0.3–1.2)
Total Protein: 8.2 g/dL — ABNORMAL HIGH (ref 6.5–8.1)

## 2024-04-13 LAB — COLOGUARD: COLOGUARD: NEGATIVE

## 2024-04-20 ENCOUNTER — Other Ambulatory Visit: Payer: Self-pay

## 2024-04-20 DIAGNOSIS — Z1231 Encounter for screening mammogram for malignant neoplasm of breast: Secondary | ICD-10-CM
